# Patient Record
Sex: Male | Born: 1975 | Hispanic: No | Marital: Married | Smoking: Never smoker
Health system: Southern US, Community
[De-identification: ages and names within clinical notes are randomized; demographics above are authoritative.]

## PROBLEM LIST (undated history)

## (undated) DIAGNOSIS — I10 Essential (primary) hypertension: Secondary | ICD-10-CM

---

## 1999-09-15 ENCOUNTER — Emergency Department (HOSPITAL_COMMUNITY): Admission: EM | Admit: 1999-09-15 | Discharge: 1999-09-15 | Payer: Self-pay | Admitting: Emergency Medicine

## 2000-04-16 ENCOUNTER — Encounter: Payer: Self-pay | Admitting: Emergency Medicine

## 2000-04-16 ENCOUNTER — Emergency Department (HOSPITAL_COMMUNITY): Admission: EM | Admit: 2000-04-16 | Discharge: 2000-04-16 | Payer: Self-pay | Admitting: Emergency Medicine

## 2006-06-05 ENCOUNTER — Emergency Department (HOSPITAL_COMMUNITY): Admission: EM | Admit: 2006-06-05 | Discharge: 2006-06-05 | Payer: Self-pay | Admitting: Emergency Medicine

## 2006-07-09 ENCOUNTER — Emergency Department (HOSPITAL_COMMUNITY): Admission: EM | Admit: 2006-07-09 | Discharge: 2006-07-10 | Payer: Self-pay | Admitting: Emergency Medicine

## 2014-01-11 ENCOUNTER — Emergency Department (HOSPITAL_COMMUNITY)
Admission: EM | Admit: 2014-01-11 | Discharge: 2014-01-11 | Disposition: A | Discharge status: Home / Self Care | Payer: Self-pay | Attending: Emergency Medicine | Admitting: Emergency Medicine

## 2014-01-11 ENCOUNTER — Emergency Department (HOSPITAL_COMMUNITY): Payer: Self-pay

## 2014-01-11 ENCOUNTER — Encounter (HOSPITAL_COMMUNITY): Payer: Self-pay | Admitting: Emergency Medicine

## 2014-01-11 DIAGNOSIS — Y929 Unspecified place or not applicable: Secondary | ICD-10-CM | POA: Insufficient documentation

## 2014-01-11 DIAGNOSIS — Y9389 Activity, other specified: Secondary | ICD-10-CM | POA: Insufficient documentation

## 2014-01-11 DIAGNOSIS — S99929A Unspecified injury of unspecified foot, initial encounter: Principal | ICD-10-CM

## 2014-01-11 DIAGNOSIS — M25561 Pain in right knee: Secondary | ICD-10-CM

## 2014-01-11 DIAGNOSIS — S8990XA Unspecified injury of unspecified lower leg, initial encounter: Secondary | ICD-10-CM | POA: Insufficient documentation

## 2014-01-11 DIAGNOSIS — S99919A Unspecified injury of unspecified ankle, initial encounter: Principal | ICD-10-CM

## 2014-01-11 DIAGNOSIS — IMO0002 Reserved for concepts with insufficient information to code with codable children: Secondary | ICD-10-CM | POA: Insufficient documentation

## 2014-01-11 DIAGNOSIS — I10 Essential (primary) hypertension: Secondary | ICD-10-CM | POA: Insufficient documentation

## 2014-01-11 HISTORY — DX: Essential (primary) hypertension: I10

## 2014-01-11 MED ORDER — HYDROCODONE-ACETAMINOPHEN 5-325 MG PO TABS: 1.0000 | ORAL_TABLET | Freq: Four times a day (QID) | ORAL | Status: DC | PRN

## 2014-01-11 NOTE — Progress Notes (Signed)
[facility] Community Liaison Stacy, ° °Provided pt with a list of primary care resources to help patient establish a pcp.  °

## 2014-01-11 NOTE — Discharge Instructions (Signed)

## 2014-01-11 NOTE — ED Notes (Signed)
Patient was at [facility] practice last night. Patient states he fell on top of his right knee. Patient c/o pain to left side of knee. Swelling and redness to top of knee

## 2014-01-11 NOTE — ED Provider Notes (Signed)
CSN: [id number]     Arrival date & time 01/11/14  0920 History   First MD Initiated Contact with Patient 01/11/14 1004     Chief Complaint  Patient presents with  . Knee Pain   Patient is a 38 y.o. male presenting with knee pain.  Knee Pain   Patient is a 38 y.o male who presents with left knee pain.  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  Patient states that his knee started swelling.  Patient states that he feels that his knee has been unstable when he is walking.  Patient states that he tried tylenol at home with little relief.  Patient denies prior injury.   Patient denies tingling, numbness, rash, fever, chills, nausea, vomiting.   Patient admits to swelling and unsteady gait.  All other ROS are negative.  Past Medical History  Diagnosis Date  . Hypertension    History reviewed. No pertinent past surgical history. History reviewed. No pertinent family history. History  Substance Use Topics  . Smoking status: Never Smoker   . Smokeless tobacco: Not on file  . Alcohol Use: No    Review of Systems  See HPI  Allergies  Review of patient's allergies indicates not on file.  Home Medications   Prior to Admission medications   Medication Sig Start Date End Date Taking? Authorizing Provider  HYDROcodone-acetaminophen (NORCO/VICODIN) 5-325 MG per tablet Take 1 tablet by mouth every 6 (six) hours as needed for moderate pain or severe pain. 01/11/14   Xuan Mateus A Forcucci, PA-C   BP 135/89  Pulse 72  Temp(Src) 98.6 F (37 C) (Oral)  Resp 16  SpO2 98% Physical Exam  Nursing note and vitals reviewed. Constitutional: He is oriented to person, place, and time. He appears well-developed and well-nourished. No distress.  HENT:  Head: Normocephalic and atraumatic.  Mouth/Throat: Oropharynx is clear and moist. No oropharyngeal exudate.  Eyes: Conjunctivae and EOM are normal. Pupils are  equal, round, and reactive to light. No scleral icterus.  Neck: Normal range of motion. Neck supple. No JVD present. No thyromegaly present.  Cardiovascular: Normal rate, regular rhythm, normal heart sounds and intact distal pulses.  Exam reveals no gallop and no friction rub.   No murmur heard. Pulmonary/Chest: Effort normal and breath sounds normal. No respiratory distress. He has no wheezes. He has no rales. He exhibits no tenderness.  Musculoskeletal:       Right knee: He exhibits decreased range of motion, swelling, effusion and MCL laxity. He exhibits no ecchymosis, no deformity, no laceration, no erythema, normal alignment, no LCL laxity, normal patellar mobility, no bony tenderness and normal meniscus. Tenderness found. Medial joint line, MCL and patellar tendon tenderness noted. No lateral joint line and no LCL tenderness noted.  Lymphadenopathy:    He has no cervical adenopathy.  Neurological: He is alert and oriented to person, place, and time. No cranial nerve deficit. Coordination normal.  Skin: Skin is warm and dry. No rash noted. He is not diaphoretic.  Psychiatric: He has a normal mood and affect. His behavior is normal. Judgment and thought content normal.    ED Course  Procedures (including critical care time) Labs Review Labs Reviewed - No data to display  Imaging Review Dg Knee Complete 4 Views Right  01/11/2014   CLINICAL DATA:  Right knee pain and limited range of motion secondary to trauma last night.  EXAM: RIGHT  KNEE - COMPLETE 4+ VIEW  COMPARISON:  None.  FINDINGS: No fracture or dislocation. Small joint effusion. Soft tissue swelling at the medial aspect of the knee.  IMPRESSION: No acute osseous abnormality. Soft tissue swelling medially. Small joint effusion.   Electronically Signed   By: Geanie Cooley M.D.   On: 01/11/2014 10:25     EKG Interpretation None      MDM   Final diagnoses:  Right knee pain    Patient is a 38 y.o. Male who presents to the ED  with right knee pain.  Physical examination reveals laxity of the right MCL and knee effusion.  Plain film xray is negative.  Suspect MCL tear.  Patient will be placed in straight leg immobilizer.  Patient to follow-up with ortho.  Need for possible MRI discussed.  No signs of compartment syndrome.  Will prescribe hydrocodone #10.  Patient to be given crutches.  Patient is stable for discharge at this time.  Patient told to return for compartment syndrome.  Patient states understanding and agreement.      Eben Burow, PA-C 01/11/14 1058

## 2014-01-11 NOTE — ED Provider Notes (Signed)
Medical screening examination/treatment/procedure(s) were performed by non-physician practitioner and as supervising physician I was immediately available for consultation/collaboration.  Toy Cookey, MD 01/11/14 2022

## 2014-01-12 ENCOUNTER — Other Ambulatory Visit (HOSPITAL_COMMUNITY): Payer: Self-pay | Admitting: Orthopaedic Surgery

## 2014-01-12 DIAGNOSIS — M25561 Pain in right knee: Secondary | ICD-10-CM

## 2014-01-25 ENCOUNTER — Ambulatory Visit (HOSPITAL_COMMUNITY): Admission: RE | Admit: 2014-01-25 | Payer: Self-pay | Source: Ambulatory Visit

## 2014-08-08 ENCOUNTER — Ambulatory Visit (INDEPENDENT_AMBULATORY_CARE_PROVIDER_SITE_OTHER): Payer: Self-pay | Admitting: Urgent Care

## 2014-08-08 VITALS — BP 138/94 | HR 61 | Temp 98.0°F | Resp 17 | Ht 68.0 in | Wt 185.0 lb

## 2014-08-08 DIAGNOSIS — I10 Essential (primary) hypertension: Secondary | ICD-10-CM

## 2014-08-08 DIAGNOSIS — M25561 Pain in right knee: Secondary | ICD-10-CM

## 2014-08-08 LAB — BASIC METABOLIC PANEL
BUN: 16 mg/dL (ref 6–23)
CALCIUM: 9.4 mg/dL (ref 8.4–10.5)
CO2: 30 mEq/L (ref 19–32)
Chloride: 102 mEq/L (ref 96–112)
Creat: 0.68 mg/dL (ref 0.50–1.35)
Glucose, Bld: 90 mg/dL (ref 70–99)
Potassium: 4.2 mEq/L (ref 3.5–5.3)
SODIUM: 139 meq/L (ref 135–145)

## 2014-08-08 MED ORDER — AMLODIPINE BESYLATE 5 MG PO TABS: 5.0000 mg | ORAL_TABLET | Freq: Every day | ORAL | Status: AC

## 2014-08-08 MED ORDER — LISINOPRIL-HYDROCHLOROTHIAZIDE 20-25 MG PO TABS: 1.0000 | ORAL_TABLET | Freq: Every day | ORAL | Status: AC

## 2014-08-08 NOTE — Patient Instructions (Addendum)
[facility] [address] [place], [place]   Hipertensin (Hypertension) La hipertensin, conocida comnmente como presin arterial alta, se produce cuando la sangre bombea en las arterias con mucha fuerza. Las arterias son los vasos sanguneos que transportan la sangre desde el corazn hacia todas las partes del cuerpo. Una lectura de la presin arterial consiste en un nmero ms alto sobre un nmero ms bajo, por ejemplo, 110/72. El nmero ms alto (presin sistlica) corresponde a la presin interna de las arterias cuando el corazn [place]. El nmero ms bajo (presin diastlica) corresponde a la presin interna de las arterias cuando el corazn se relaja. En condiciones ideales, la presin arterial debe ser inferior a 120/80. La hipertensin fuerza al corazn a trabajar ms para Marine scientist. Las arterias pueden estrecharse o ponerse rgidas. La hipertensin conlleva el riesgo de enfermedad cardaca, ictus y otros problemas.  FACTORES DE RIESGO Algunos factores de riesgo de hipertensin son controlables, pero otros no lo son.  [employer] factores de riesgo que usted no puede Chief Operating Officer, se incluyen:   Nurse, learning disability. El riesgo es mayor para las Statistician.  La edad. Los riesgos aumentan con la edad.  El sexo. Antes de los 45aos, los hombres corren ms [employer]. Despus de los 65aos, las mujeres corren ms [employer]. Entre los factores de riesgo que usted puede Chief Operating Officer, se incluyen:  No hacer la cantidad suficiente de actividad fsica o ejercicio.  Tener sobrepeso.  Consumir mucha grasa, azcar, caloras o sal en la dieta.  Beber alcohol en exceso. SIGNOS Y SNTOMAS Por lo general, la hipertensin no causa signos o sntomas. La hipertensin demasiado alta (crisis hipertensiva) puede causar dolor de cabeza, ansiedad, falta de aire y hemorragia nasal. DIAGNSTICO  Para detectar si usted tiene hipertensin, el mdico le medir la  presin arterial mientras est sentado, con el brazo levantado a la altura del corazn. Debe medirla al [facility] veces en el mismo brazo. Determinadas condiciones pueden causar una diferencia de presin arterial entre el brazo izquierdo y Aeronautical engineer. El hecho de tener una sola lectura de la presin arterial ms alta que lo normal no significa que Research scientist (physical sciences). En el caso de tener una lectura de la presin arterial con un valor alto, pdale al mdico que la verifique nuevamente. TRATAMIENTO  El tratamiento de la hipertensin arterial incluye hacer cambios en el estilo de vida y, posiblemente, tomar medicamentos. Un estilo de vida saludable puede ayudar a bajar la presin arterial alta. Quiz deba cambiar algunos hbitos. Los [employer] en el estilo de vida pueden incluir:  Seguir la dieta DASH. Esta dieta tiene un alto contenido de frutas, verduras y Radiation protection practitioner. Incluye poca cantidad de sal, carnes rojas y azcares agregados.  Hacer al menos 2horas de actividad fsica enrgica todas las semanas.  Perder peso, si es necesario.  No fumar.  Limitar el consumo de bebidas alcohlicas.  Aprender formas de reducir el estrs. Si los cambios en el estilo de vida no son suficientes para Museum/gallery curator la presin arterial, el mdico puede recetarle medicamentos. Quiz necesite tomar ms de uno. Trabaje en conjunto con su mdico para comprender los riesgos y los beneficios. INSTRUCCIONES PARA EL CUIDADO EN EL HOGAR  Haga que le midan de nuevo la presin arterial segn las indicaciones del mdico.  Tome los medicamentos solamente como se lo haya indicado el mdico. Siga cuidadosamente las indicaciones. Los medicamentos para la presin arterial deben tomarse segn las indicaciones. Los medicamentos pierden  eficacia al omitir las dosis. El hecho de omitir las dosis tambin [place]oth[place]umenta el riesgo de otros problemas.  No fume.  Contrlese la presin arterial en su casa segn las indicaciones  del mdico. SOLICITE ATENCIN MDICA SI:   Piensa que tiene una reaccin alrgica a los medicamentos.  Tiene mareos o dolores de cabeza con Naval architectrecurrencia.  Tiene hinchazn en los tobillos.  Tiene problemas de visin. SOLICITE ATENCIN MDICA DE INMEDIATO SI:  Siente un dolor de cabeza intenso o confusin.  Siente debilidad inusual, adormecimiento o que Hospital doctorse desmayar.  Siente dolor intenso en el pecho o en el abdomen.  Vomita repetidas veces.  Tiene dificultad para respirar. ASEGRESE DE QUE:   Comprende estas instrucciones.  Controlar su afeccin.  Recibir ayuda de inmediato si no mejora o si empeora. Document Released: 04/20/2005 Document Revised: 09/04/2013 [facility]Exit[facility] Patient Information 2015 [place], [place]. This information is not intended to replace advice given to you by your health care provider. Make sure you discuss any questions you have with your health care provider.

## 2014-08-08 NOTE — Progress Notes (Signed)
    MRN: [id number] DOB: 1975/06/08  Subjective:   Benjamin Knox is a 39 y.o. male with pmh of HTN presenting for medication refill.  HTN - currently managed with bisoprolol-HCT and lis-HCT. He checks BP at pharmacy, has been as high as 160 systolic, generally in 130's. Has seen cardiologist (05/2013), had normal work-up, per patient this included ECHO of kidneys which he states was also normal. 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 Denies chest pain, heart racing, palpitations, shob, lower leg swelling, double vision, sudden blurred vision. Patient has a history of cataract in left eye, had this surgically repaired ~1 year ago, also has "bad" vision in his right eye, is scheduled to see his ophthalmologist soon. Denies smoking or alcohol use.   Knee pain - reports several month history of right knee pain after sustaining injury during MMA training. Has been seen by orthopedics who managed conservatively with NSAIDs. They recommended an MRI for further evaluation of ligament damage but patient was unable to afford this so he stopped following up. Patient would like to know what his options are. Today, he reports ongoing right knee pain and swelling although these are both improved. Denies erythema, warmth, fevers. Denies any other aggravating or relieving factors, no other questions or concerns.  Benjamin Knox has a current medication list which includes the following prescription(s): bisoprolol-hydrochlorothiazide and lisinopril-hydrochlorothiazide. He has No Known Allergies.  Benjamin Knox  has a past medical history of Hypertension. Also  has no past surgical history on file.  ROS As in subjective.  Objective:   Vitals: BP 138/94 mmHg  Pulse 61  Temp(Src) 98 F (36.7 C) (Oral)  Resp 17  Ht 5\' 8"  (1.727 m)  Wt 185 lb (83.915 kg)  BMI 28.14 kg/m2  SpO2 99%  Physical Exam  Constitutional: He is well-developed, well-nourished, and in no  distress.  HENT:  Mouth/Throat: Oropharynx is clear and moist.  Eyes: Right eye exhibits no discharge. Left eye exhibits no discharge. No scleral icterus.  Dilated right eye, not reactive to light.  Neck: No thyromegaly present.  Cardiovascular: Normal rate, regular rhythm and intact distal pulses.  Exam reveals no gallop and no friction rub.   No murmur heard. Pulmonary/Chest: No respiratory distress. He has no wheezes. He has no rales. He exhibits no tenderness.  Abdominal: Soft. Bowel sounds are normal. He exhibits no distension and no mass. There is no tenderness.  Skin: Skin is warm and dry. No rash noted. No erythema. No pallor.   Assessment and Plan :   1. Essential hypertension - Stop bisoprolol-HCT, start lis-HCT 20-25mg  and amlodipine 5mg . Requested patient check BP weekly, call with BP measurements in 4 weeks. Continue diet and exercise. Follow up visit in 6 months.   2. Right knee pain - Recommended patient follow up with ortho and obtain MRI as they believed this was the appropriate next step. In the meantime, suggested patient use NSAID therapy. Return to clinic as needed.  Wallis BambergMario Rayshawn Maney, PA-C [facility] [phone] 08/08/2014 10:59 AM

## 2014-08-15 ENCOUNTER — Telehealth: Payer: Self-pay

## 2014-08-15 NOTE — Telephone Encounter (Signed)
Pt states he wanted Marquita PalmsMario to know the bp medicine he was given is working good, however he really would like to have a call back at [phone]

## 2014-08-15 NOTE — Telephone Encounter (Signed)
Called pt but he wants you to call him.

## 2014-08-16 NOTE — Telephone Encounter (Signed)
Patient took his blood pressure and measured it at 110 systolic. He wanted to know if this was okay. He has never had it that low. I reassured patient. He denies dizziness, lightheadeadedness, heart racing, palpitations. I recommended he continue with our current treatment plan for his hypertension. Return to clinic in 6 months for follow-up.  Benjamin BambergMario Kyden Potash, PA-C [facility] [phone] 08/16/2014  5:30 PM

## 2017-08-21 ENCOUNTER — Other Ambulatory Visit: Payer: Self-pay

## 2017-08-21 ENCOUNTER — Emergency Department (HOSPITAL_COMMUNITY)
Admission: EM | Admit: 2017-08-21 | Discharge: 2017-08-22 | Disposition: A | Discharge status: Home / Self Care | Payer: Self-pay | Attending: Emergency Medicine | Admitting: Emergency Medicine

## 2017-08-21 ENCOUNTER — Encounter (HOSPITAL_COMMUNITY): Payer: Self-pay

## 2017-08-21 DIAGNOSIS — I10 Essential (primary) hypertension: Secondary | ICD-10-CM | POA: Insufficient documentation

## 2017-08-21 DIAGNOSIS — G8929 Other chronic pain: Secondary | ICD-10-CM | POA: Insufficient documentation

## 2017-08-21 DIAGNOSIS — R42 Dizziness and giddiness: Secondary | ICD-10-CM | POA: Insufficient documentation

## 2017-08-21 DIAGNOSIS — Z79899 Other long term (current) drug therapy: Secondary | ICD-10-CM | POA: Insufficient documentation

## 2017-08-21 DIAGNOSIS — R51 Headache: Secondary | ICD-10-CM

## 2017-08-21 LAB — BASIC METABOLIC PANEL
Anion gap: 11 (ref 5–15)
BUN: 9 mg/dL (ref 6–20)
CO2: 24 mmol/L (ref 22–32)
Calcium: 9 mg/dL (ref 8.9–10.3)
Chloride: 104 mmol/L (ref 101–111)
Creatinine, Ser: 0.63 mg/dL (ref 0.61–1.24)
GFR calc Af Amer: >60 mL/min (ref >60)
GLUCOSE: 129 mg/dL — AB (ref 65–99)
POTASSIUM: 3.4 mmol/L — AB (ref 3.5–5.1)
Sodium: 139 mmol/L (ref 135–145)

## 2017-08-21 LAB — URINALYSIS, ROUTINE W REFLEX MICROSCOPIC
Bilirubin Urine: NEGATIVE
Glucose, UA: NEGATIVE mg/dL
Hgb urine dipstick: NEGATIVE
Ketones, ur: NEGATIVE mg/dL
Leukocytes, UA: NEGATIVE
NITRITE: NEGATIVE
PH: 6 (ref 5.0–8.0)
Protein, ur: NEGATIVE mg/dL
SPECIFIC GRAVITY, URINE: 1.014 (ref 1.005–1.030)

## 2017-08-21 LAB — CBC
HEMATOCRIT: 42.1 % (ref 39.0–52.0)
HEMOGLOBIN: 14.7 g/dL (ref 13.0–17.0)
MCH: 28.7 pg (ref 26.0–34.0)
MCHC: 34.9 g/dL (ref 30.0–36.0)
MCV: 82.1 fL (ref 78.0–100.0)
Platelets: 298 10*3/uL (ref 150–400)
RBC: 5.13 MIL/uL (ref 4.22–5.81)
RDW: 13.4 % (ref 11.5–15.5)
WBC: 8.5 10*3/uL (ref 4.0–10.5)

## 2017-08-21 LAB — CBG MONITORING, ED: Glucose-Capillary: 136 mg/dL — ABNORMAL HIGH (ref 65–99)

## 2017-08-21 NOTE — ED Triage Notes (Signed)
Pt states that for the past month and a half has had a R sided frontal headache, saw his PCP and told to follow up with his eye doctor. Pt states since Wednesday he has been having room spinning dizziness. Denies syncope.

## 2017-08-21 NOTE — ED Provider Notes (Addendum)
[facility]   CSN: [id number] Arrival date & time: 08/21/17  2000     History   Chief Complaint Chief Complaint  Patient presents with  . Dizziness    HPI Benjamin Knox is a 42 y.o. male.  HPI  42 y/o M with hx of HTN comes in with  CC of headaches. Headache is frontal on the right side and has been intermittently present for the last month and a half. The headache is now more constant.  Pt saw his pcp, who advised that pt see Ophthalmology. For the last 3 days pt has been having dizziness/vertigo. Pt denies any hearing loss or tinnitus.  Patient states that his dizziness is typically present when he gets up or tries to walk.  No associated numbness, tingling, vision change that is new/  Pt has been taking Excedrin w/o any relief. Pt denies similar headache.   Past Medical History:  Diagnosis Date  . Hypertension     There are no active problems to display for this patient.   History reviewed. No pertinent surgical history.      Home Medications    Prior to Admission medications   Medication Sig Start Date End Date Taking? Authorizing Provider  amLODipine (NORVASC) 10 MG tablet Take 10 mg by mouth daily. 08/07/17  Yes [provider]  metoprolol succinate (TOPROL-XL) 25 MG 24 hr tablet Take 25 mg by mouth daily. 08/06/17  Yes [provider]  acetaminophen (TYLENOL) 325 MG tablet Take 2 tablets (650 mg total) by mouth every 6 (six) hours as needed. 08/22/17   Derwood KaplanNanavati, Lamond Glantz, MD  amLODipine (NORVASC) 5 MG tablet Take 1 tablet (5 mg total) by mouth daily. Patient not taking: Reported on 08/22/2017 08/08/14   Wallis BambergMani, Mario, PA-C  ibuprofen (ADVIL,MOTRIN) 600 MG tablet Take 1 tablet (600 mg total) by mouth every 6 (six) hours as needed. 08/22/17   Derwood KaplanNanavati, Debbra Digiulio, MD  lisinopril-hydrochlorothiazide (PRINZIDE,ZESTORETIC) 20-25 MG per tablet Take 1 tablet by mouth daily. Patient not taking: Reported on 08/22/2017  08/08/14   Wallis BambergMani, Mario, PA-C  meclizine (ANTIVERT) 25 MG tablet Take 1 tablet (25 mg total) by mouth 3 (three) times daily as needed for dizziness. 08/22/17   Derwood KaplanNanavati, Devika Dragovich, MD    Family History No family history on file.  Social History Social History   Tobacco Use  . Smoking status: Never Smoker  . Smokeless tobacco: Never Used  Substance Use Topics  . Alcohol use: No  . Drug use: Not on file     Allergies   Patient has no known allergies.   Review of Systems Review of Systems  All other systems reviewed and are negative.    Physical Exam Updated Vital Signs BP 116/84   Pulse 67   Temp 98.5 F (36.9 C) (Oral)   Resp (!) 23   Ht 5\' 5"  (1.651 m)   Wt 86.2 kg (190 lb)   SpO2 96%   BMI 31.62 kg/m   Physical Exam  Constitutional: He is oriented to person, place, and time. He appears well-developed.  HENT:  Head: Atraumatic.  Eyes:  Right eye has abnormally shaped pupil.   Neck: Neck supple.  Cardiovascular: Normal rate.  Pulmonary/Chest: Effort normal.  Neurological: He is alert and oriented to person, place, and time. No cranial nerve deficit.  Cerebellar exam is normal (finger to nose) Sensory exam normal for bilateral upper and lower extremities - and patient is able to discriminate between sharp and  dull. Motor exam is 4+/5 HINTs exam is equivocal  Skin: Skin is warm.  Nursing note and vitals reviewed.    ED Treatments / Results  Labs (all labs ordered are listed, but only abnormal results are displayed) Labs Reviewed  BASIC METABOLIC PANEL - Abnormal; Notable for the following components:      Result Value   Potassium 3.4 (*)    Glucose, Bld 129 (*)    All other components within normal limits  CBG MONITORING, ED - Abnormal; Notable for the following components:   Glucose-Capillary 136 (*)    All other components within normal limits  CBC  URINALYSIS, ROUTINE W REFLEX MICROSCOPIC    EKG EKG Interpretation  Date/Time:  Saturday August 21 2017 20:14:05 EDT Ventricular Rate:  75 PR Interval:  146 QRS Duration: 84 QT Interval:  386 QTC Calculation: 431 R Axis:   -67 Text Interpretation:  Normal sinus rhythm Left anterior fascicular block Abnormal ECG No acute changes No old tracing to compare Confirmed by Derwood Kaplan [phone]) on 08/22/2017 3:08:25 AM   Radiology Ct Head Wo Contrast  Result Date: 08/22/2017 CLINICAL DATA:  Chronic right-sided frontal headache. Dizziness; vertigo. EXAM: CT HEAD WITHOUT CONTRAST TECHNIQUE: Contiguous axial images were obtained from the base of the skull through the vertex without intravenous contrast. COMPARISON:  CT of the head performed 06/05/2006 FINDINGS: Brain: No evidence of acute infarction, hemorrhage, hydrocephalus, extra-axial collection or mass lesion/mass effect. Scattered brain calcifications are again noted, stable from prior study. This likely reflects remote infection. Remote cysticercosis remains a possibility. The posterior fossa, including the cerebellum, brainstem and fourth ventricle, is within normal limits. The third and lateral ventricles, and basal ganglia are unremarkable in appearance. The cerebral hemispheres are symmetric in appearance, with normal gray-white differentiation. No mass effect or midline shift is seen. Vascular: No hyperdense vessel or unexpected calcification. Skull: There is no evidence of fracture; visualized osseous structures are unremarkable in appearance. Sinuses/Orbits: The orbits are within normal limits. The paranasal sinuses and mastoid air cells are well-aerated. Other: No significant soft tissue abnormalities are seen. IMPRESSION: 1. No acute intracranial pathology seen on CT. 2. Large number of brain calcifications again noted, reflecting remote infection. Electronically Signed   By: Roanna Raider M.D.   On: 08/22/2017 00:48   Mr Brain Wo Contrast  Result Date: 08/22/2017 CLINICAL DATA:  Right-sided headache.  Dizziness. EXAM: MRI HEAD WITHOUT  CONTRAST TECHNIQUE: Multiplanar, multiecho pulse sequences of the brain and surrounding structures were obtained without intravenous contrast. COMPARISON:  Head CT 08/22/2017 FINDINGS: BRAIN: The midline structures are normal. There is no acute infarct or acute hemorrhage. No mass lesion, hydrocephalus, dural abnormality or extra-axial collection. The brain parenchymal signal is normal for the patient's age. No age-advanced or lobar predominant atrophy. No chronic microhemorrhage or superficial siderosis. VASCULAR: Major intracranial arterial and venous sinus flow voids are preserved. SKULL AND UPPER CERVICAL SPINE: The visualized skull base, calvarium, upper cervical spine and extracranial soft tissues are normal. SINUSES/ORBITS: No fluid levels or advanced mucosal thickening. No mastoid or middle ear effusion. Normal orbits. IMPRESSION: Normal brain MRI. Electronically Signed   By: Deatra Robinson M.D.   On: 08/22/2017 05:15    Procedures Procedures (including critical care time)  Medications Ordered in ED Medications  metoCLOPramide (REGLAN) injection 10 mg (10 mg Intravenous Given 08/22/17 0111)  LORazepam (ATIVAN) injection 1 mg (1 mg Intravenous Given 08/22/17 0414)  ketorolac (TORADOL) 15 MG/ML injection 15 mg (15 mg Intravenous Given 08/22/17 0308)  metoCLOPramide (REGLAN) injection 10 mg (10 mg Intravenous Given 08/22/17 0308)     Initial Impression / Assessment and Plan / ED Course  I have reviewed the triage vital signs and the nursing notes.  Pertinent labs & imaging results that were available during my care of the patient were reviewed by me and considered in my medical decision making (see chart for details).  Clinical Course as of Aug 23 547  Sun Aug 22, 2017  0230 Patient reassessed -still having headache.. CT scan of the head does not have any acute findings. Given that patient is having new onset headache for more than a month now with some new neurologic findings we will  proceed with an MRI of the brain.  Patient does not have a PCP or insurance, which is why I think it might be prudent to get an MRI while he still in the ED.   [AN]  0330 If the MRI is normal then we will treat patient as if he has a complex headache/migraine -given the description of right-sided, throbbing frontal headaches.   [AN]  [id number] MRI is reviewed.  Patient is stable for discharge.  MR Brain Wo Contrast [AN]    Clinical Course User Index [AN] Derwood Kaplan, MD    DDX includes: Primary headaches - including migrainous headaches, cluster headaches, tension headaches. ICH Carotid dissection Cavernous sinus thrombosis Meningitis Encephalitis Sinusitis Tumor Vascular headaches AV malformation Brain aneurysm Muscular headaches  DDx for vertigo includes: Central vertigo:  Tumor  Stroke  ICH  Vertebrobasilar TIA  Peripheral Vertigo:  BPPV  Vestibular neuritis  Meniere disease  Migrainous vertigo  Ear Infection     A/P: Pt comes in with cc of headaches and dizziness. Patient's dizziness is described as vertigo, that is intermittent and typically positional.  Vertigo has been going on for about 3 days now.  Patient has been having headache close to for about a month and a half.  The headaches are right-sided, frontal and throbbing.  No history of migraines.  Patient's neurologic exam is reassuring.  HINTs exam was equivocal.  We will give patient Reglan for headache.  CT scan of the head is also been ordered.   Final Clinical Impressions(s) / ED Diagnoses   Final diagnoses:  Vertigo  Chronic nonintractable headache, unspecified headache type    ED Discharge Orders        Ordered    meclizine (ANTIVERT) 25 MG tablet  3 times daily PRN     08/22/17 0547    acetaminophen (TYLENOL) 325 MG tablet  Every 6 hours PRN     08/22/17 0547    ibuprofen (ADVIL,MOTRIN) 600 MG tablet  Every 6 hours PRN     08/22/17 0547       Derwood Kaplan, MD 08/22/17 1610      Derwood Kaplan, MD 08/22/17 [phone]

## 2017-08-22 ENCOUNTER — Emergency Department (HOSPITAL_COMMUNITY): Payer: Self-pay

## 2017-08-22 MED ORDER — IBUPROFEN 600 MG PO TABS: 600.0000 mg | ORAL_TABLET | Freq: Four times a day (QID) | ORAL | 0 refills | Status: AC | PRN

## 2017-08-22 MED ORDER — KETOROLAC TROMETHAMINE 15 MG/ML IJ SOLN
15.0000 mg | Freq: Once | INTRAMUSCULAR | Status: AC
  Administered 2017-08-22: 15 mg via INTRAVENOUS
  Filled 2017-08-22: qty 1

## 2017-08-22 MED ORDER — METOCLOPRAMIDE HCL 5 MG/ML IJ SOLN
10.0000 mg | Freq: Once | INTRAMUSCULAR | Status: AC
  Administered 2017-08-22: 10 mg via INTRAVENOUS
  Filled 2017-08-22: qty 2

## 2017-08-22 MED ORDER — LORAZEPAM 2 MG/ML IJ SOLN
1.0000 mg | Freq: Once | INTRAMUSCULAR | Status: AC
  Administered 2017-08-22: 1 mg via INTRAVENOUS
  Filled 2017-08-22: qty 1

## 2017-08-22 MED ORDER — MECLIZINE HCL 25 MG PO TABS: 25.0000 mg | ORAL_TABLET | Freq: Three times a day (TID) | ORAL | 0 refills | Status: AC | PRN

## 2017-08-22 MED ORDER — ACETAMINOPHEN 325 MG PO TABS: 650.0000 mg | ORAL_TABLET | Freq: Four times a day (QID) | ORAL | 0 refills | Status: AC | PRN

## 2017-08-22 NOTE — ED Notes (Signed)
Pt going to MRI

## 2017-08-22 NOTE — ED Notes (Signed)
While pt ambulated to the restroom pt had a steady gait but still felt dizzy.

## 2017-08-22 NOTE — Discharge Instructions (Signed)
The results of your MRI are normal.  We are sending you home with medications that should help you with the headache and the dizziness.  Please see the neurologist if your symptoms persist.

## 2019-02-17 IMAGING — CT CT HEAD W/O CM
2 of 3 series · 14 of 47 positions shown, 17 images · non-contrast
Comparison: CT of the head performed 06/05/2006

CLINICAL DATA: Chronic right-sided frontal headache. Dizziness;
vertigo.

EXAM:
CT HEAD WITHOUT CONTRAST
TECHNIQUE: Contiguous axial images were obtained from the base of the skull
through the vertex without intravenous contrast.

[Series 3: head 5.0 h30s · axial · 0.47mm/px · z∈[-79,+66]mm · 11 of 35 slices shown, 14 images]
[im 3/35  brain]
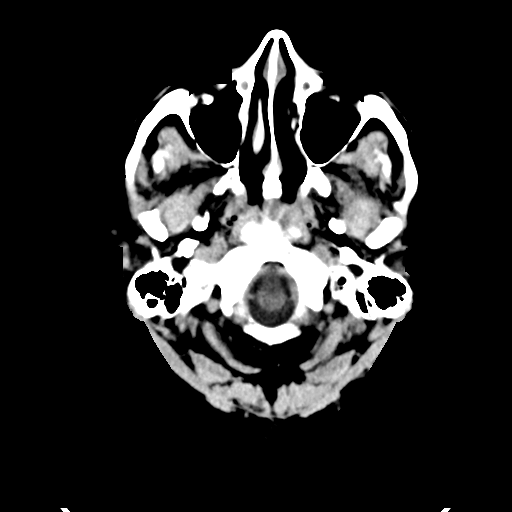
[im 3/35  bone]
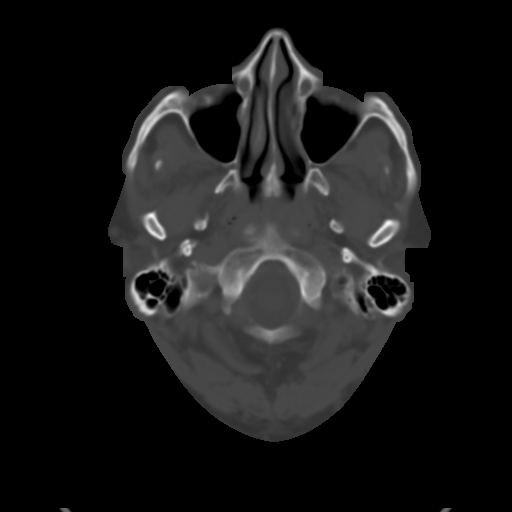
[im 5/35  brain]
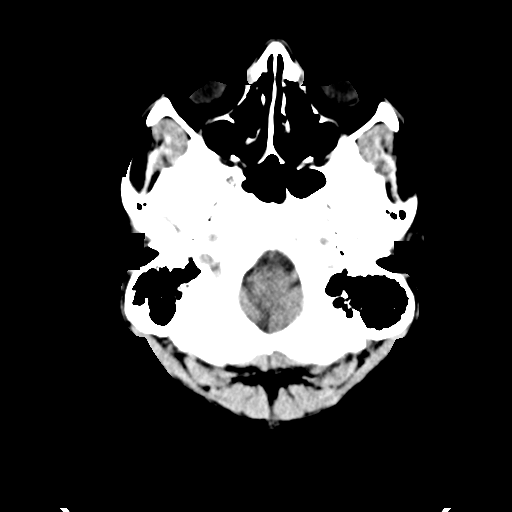
[im 9/35  brain]
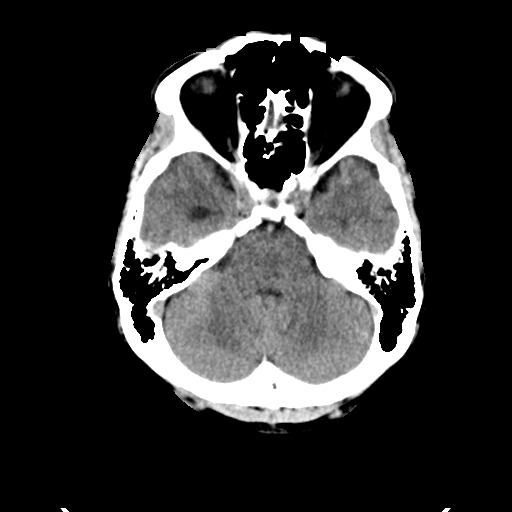
[im 11/35  brain]
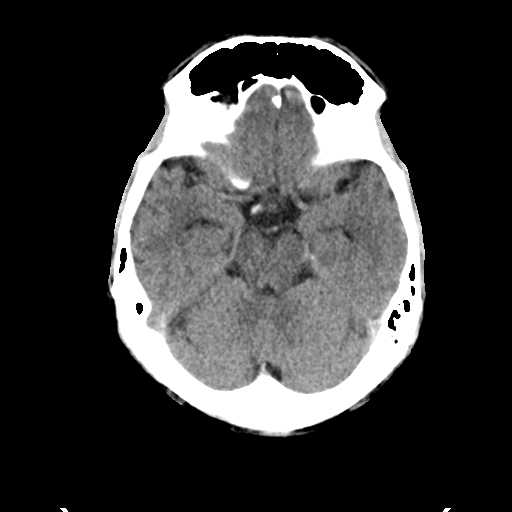
[im 15/35  brain]
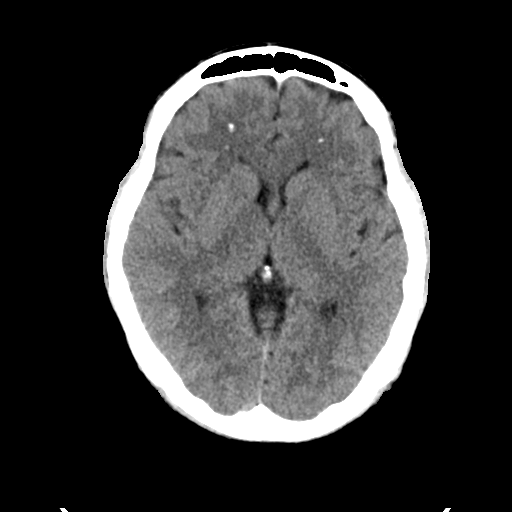
[im 15/35  bone]
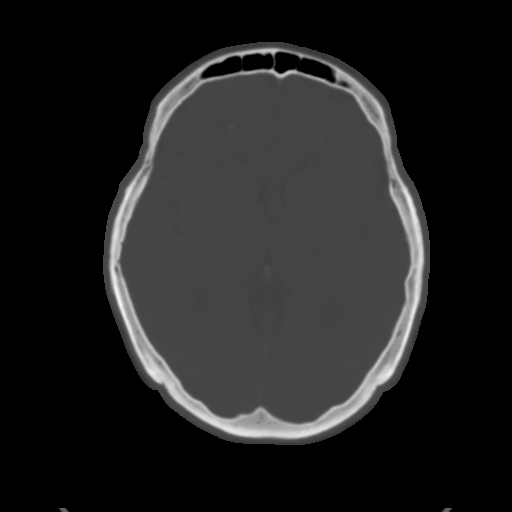
[im 18/35  brain]
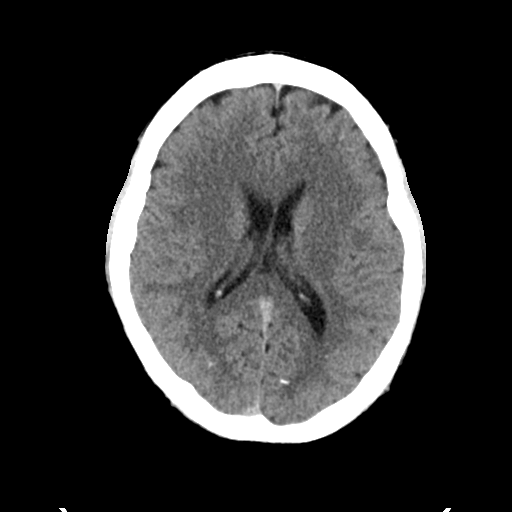
[im 20/35  brain]
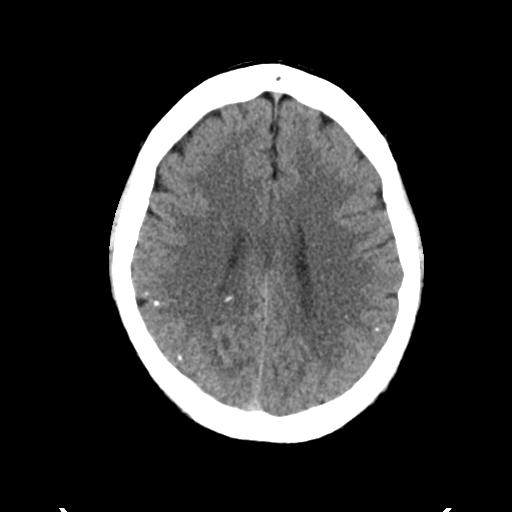
[im 24/35  brain]
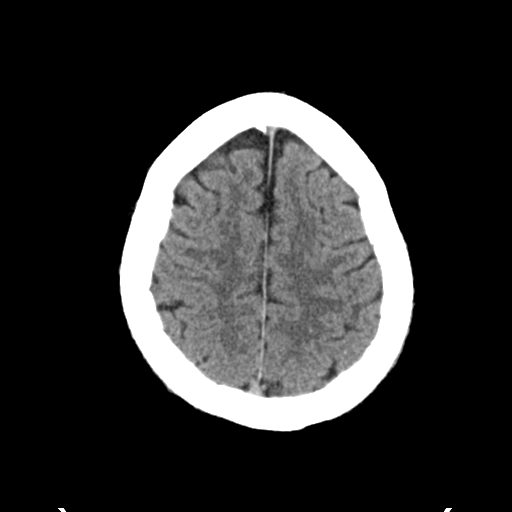
[im 26/35  brain]
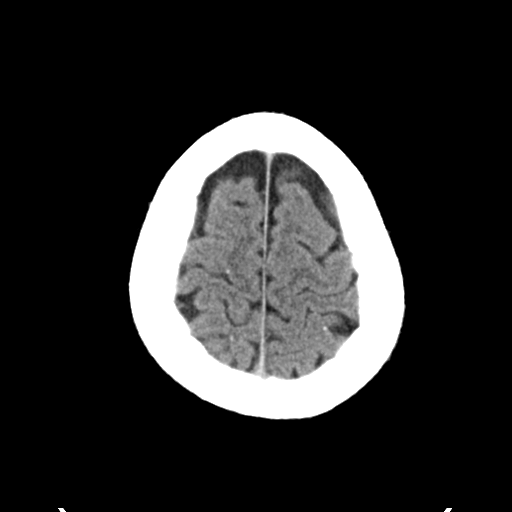
[im 26/35  bone]
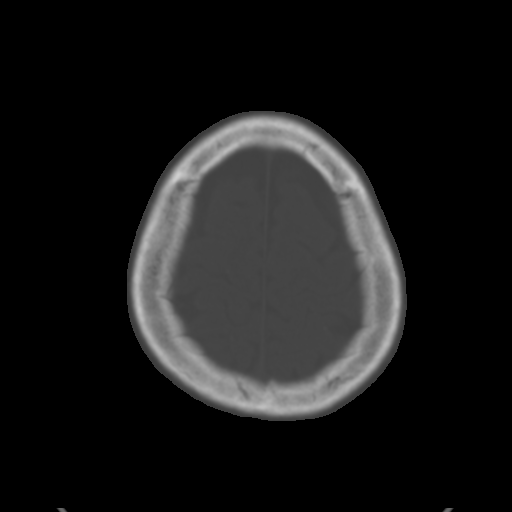
[im 30/35  brain]
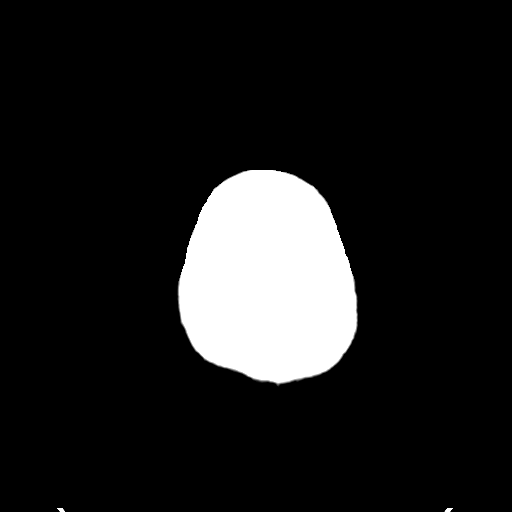
[im 32/35  brain]
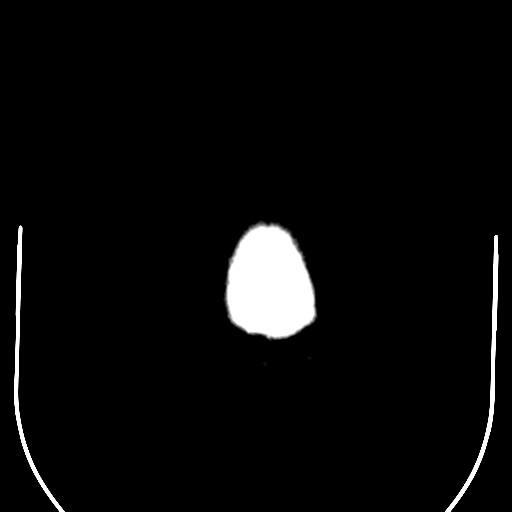

[Series 5: head 3.0 mpr cor · coronal · 0.34mm/px · 3 of 77 slices shown]
[im 26/77  brain]
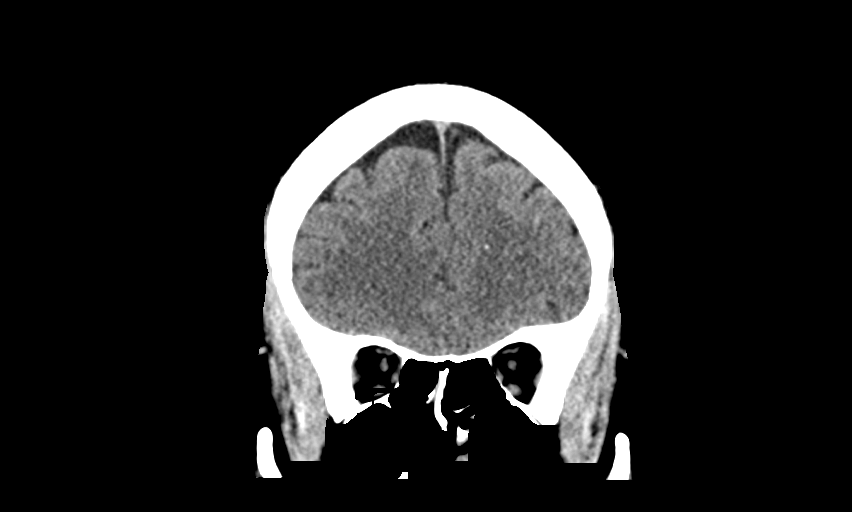
[im 34/77  brain]
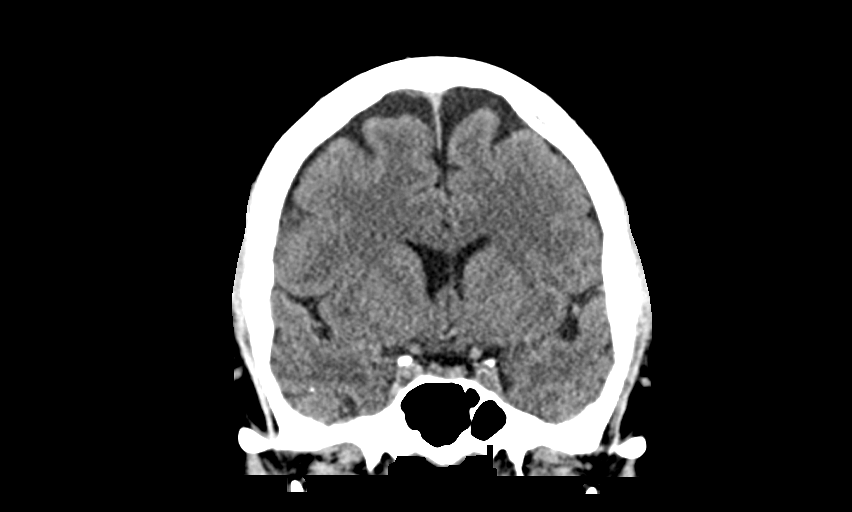
[im 43/77  brain]
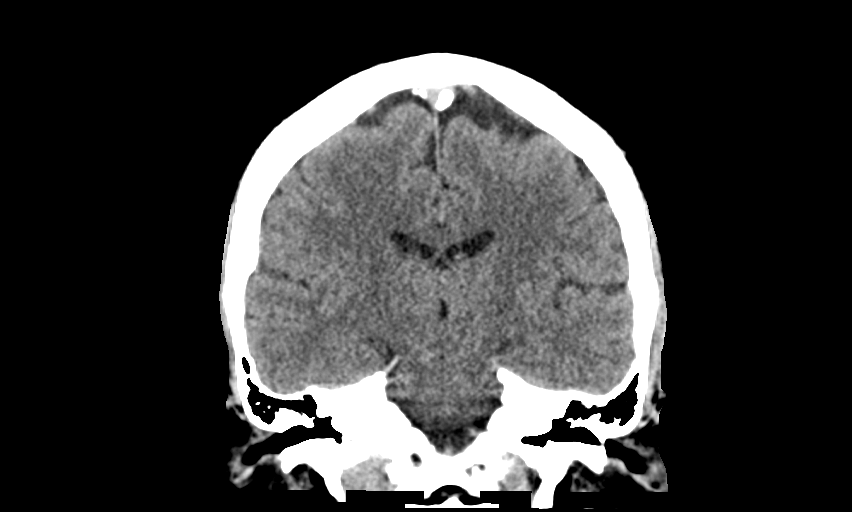

[14 of 47 positions shown; findings below may reference images not displayed]

FINDINGS: Brain: No evidence of acute infarction, hemorrhage, hydrocephalus,
extra-axial collection or mass lesion/mass effect.

Scattered brain calcifications are again noted, stable from prior
study. This likely reflects remote infection. Remote cysticercosis
remains a possibility.

The posterior fossa, including the cerebellum, brainstem and fourth
ventricle, is within normal limits. The third and lateral
ventricles, and basal ganglia are unremarkable in appearance. The
cerebral hemispheres are symmetric in appearance, with normal
gray-white differentiation. No mass effect or midline shift is seen.

Vascular: No hyperdense vessel or unexpected calcification.

Skull: There is no evidence of fracture; visualized osseous
structures are unremarkable in appearance.

Sinuses/Orbits: The orbits are within normal limits. The paranasal
sinuses and mastoid air cells are well-aerated.

Other: No significant soft tissue abnormalities are seen.
IMPRESSION: 1. No acute intracranial pathology seen on CT.
2. Large number of brain calcifications again noted, reflecting
remote infection.

## 2021-12-01 ENCOUNTER — Ambulatory Visit: Payer: Self-pay | Admitting: Family Medicine
